# Patient Record
Sex: Male | Born: 1955 | Hispanic: No | Marital: Married
Health system: Midwestern US, Community
[De-identification: ages and names within clinical notes are randomized; demographics above are authoritative.]

## PROBLEM LIST (undated history)

## (undated) DIAGNOSIS — IMO0001 Reserved for inherently not codable concepts without codable children: Secondary | ICD-10-CM

## (undated) DIAGNOSIS — J3089 Other allergic rhinitis: Secondary | ICD-10-CM

---

## 2006-05-01 IMAGING — CT CT ABDOMEN W/O CM
1 of 2 series · 15 of 32 positions shown, 19 images · non-contrast
Comparison: none

CLINICAL DATA: Left lower quadrant abdominal pain. History of nephrolithiasis.

ABDOMEN CT WITHOUT CONTRAST - URINARY STONE PROTOCOL
TECHNIQUE: Multidetector CT imaging of the abdomen was performed following the
urinary stone protocol.  No oral or intravenous contrast was administered.
TECHNIQUE: Multidetector CT imaging of the pelvis was performed following the

[Series 2: renal stone · axial · 0.74mm/px · z∈[-494,-89]mm · 15 of 90 slices shown, 19 images]
[im 5/90  soft-tissue]
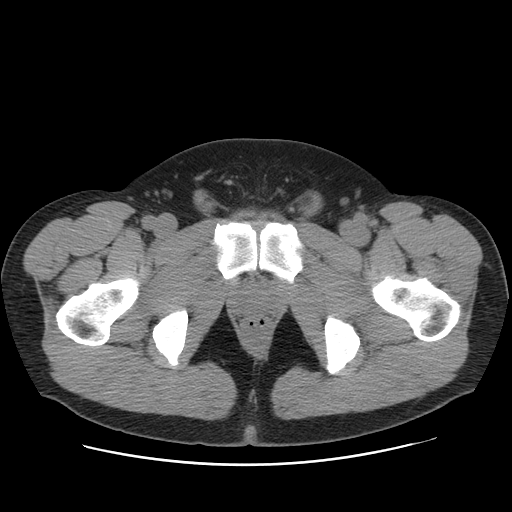
[im 5/90  bone]
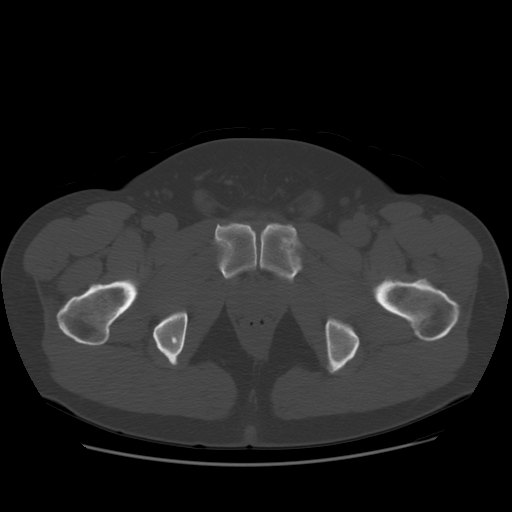
[im 13/90  soft-tissue]
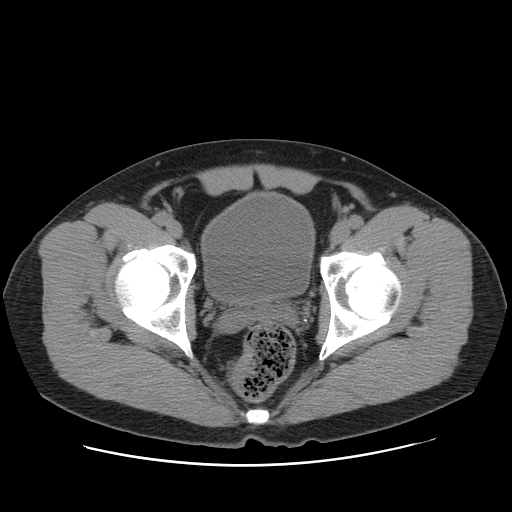
[im 21/90  soft-tissue]
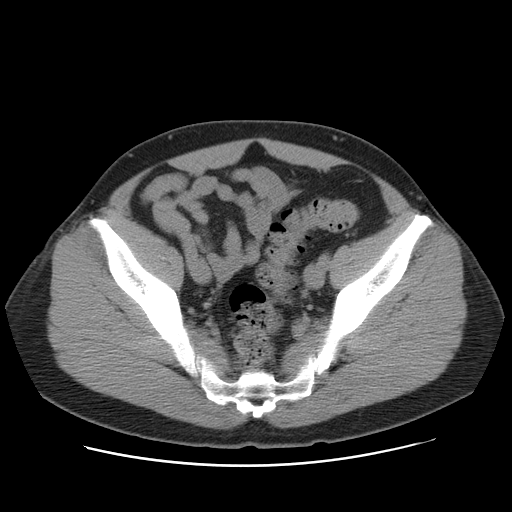
[im 25/90  soft-tissue]
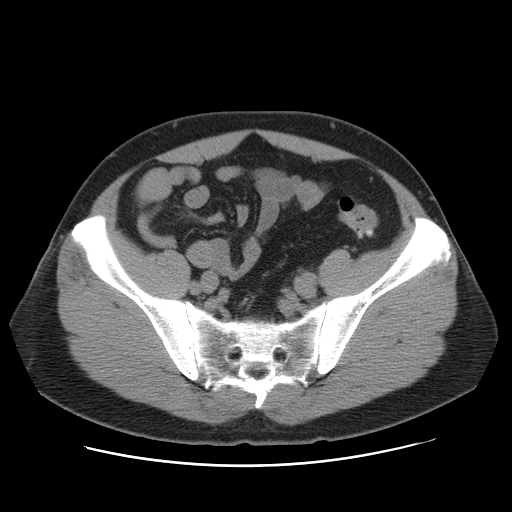
[im 33/90  soft-tissue]
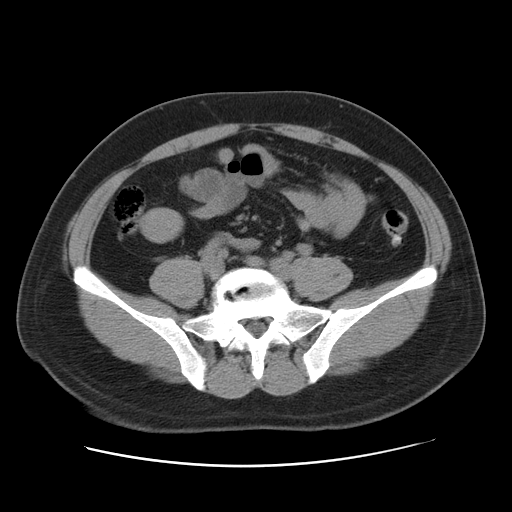
[im 37/90  soft-tissue]
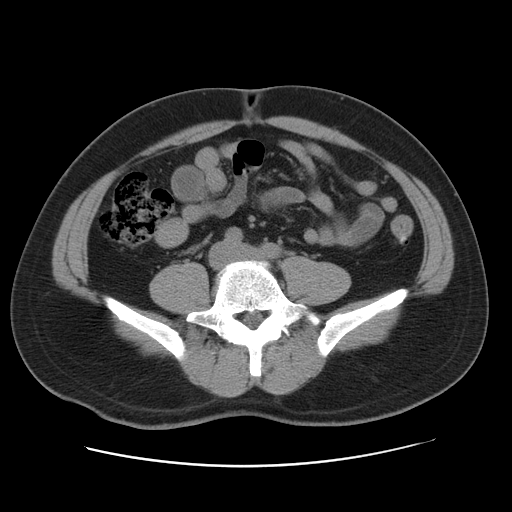
[im 45/90  soft-tissue]
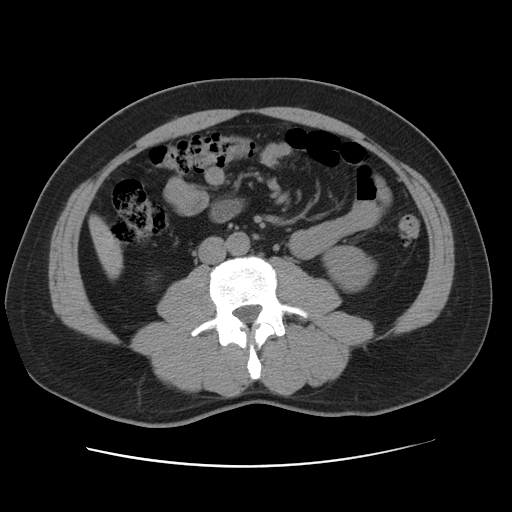
[im 53/90  soft-tissue]
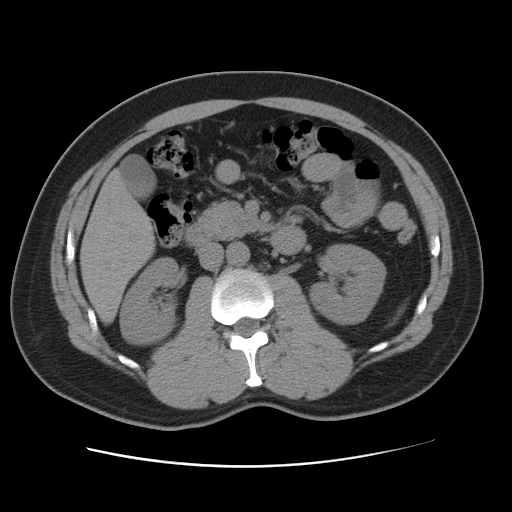
[im 57/90  soft-tissue]
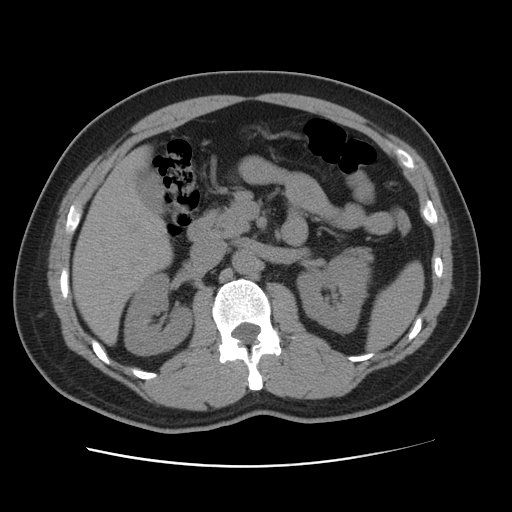
[im 57/90  bone]
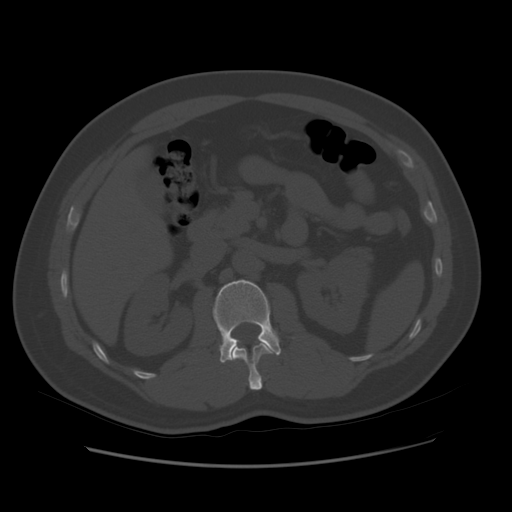
[im 65/90  soft-tissue]
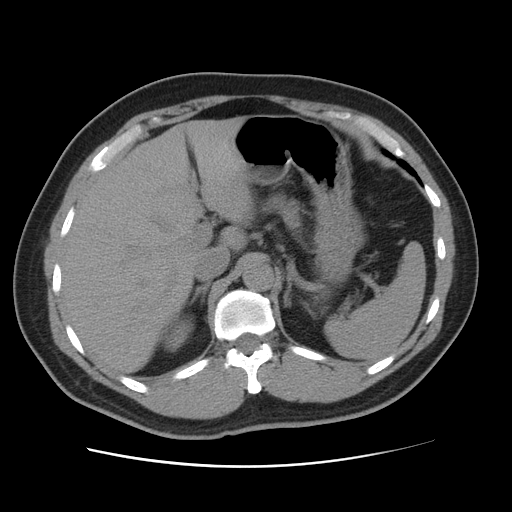
[im 69/90  soft-tissue]
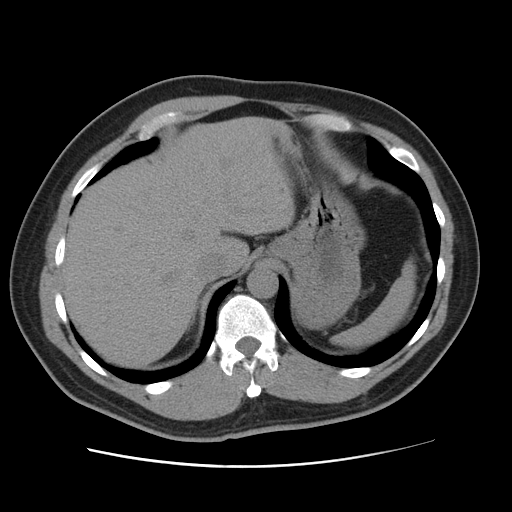
[im 73/90  lung]
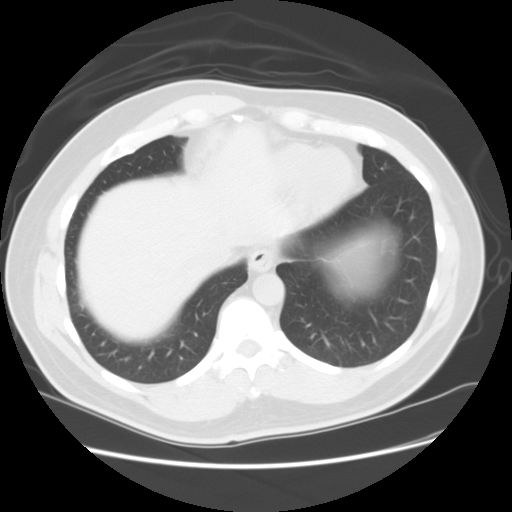
[im 77/90  soft-tissue]
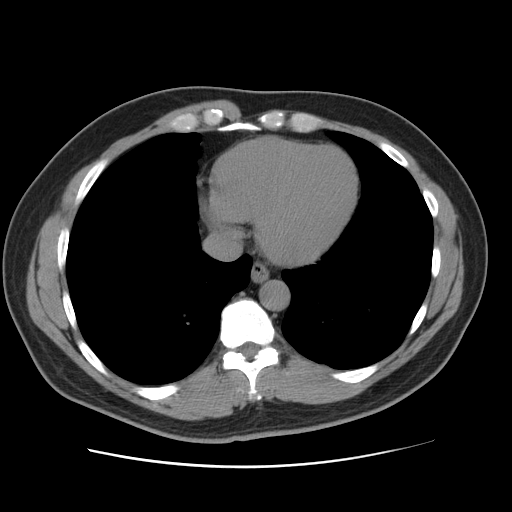
[im 77/90  lung]
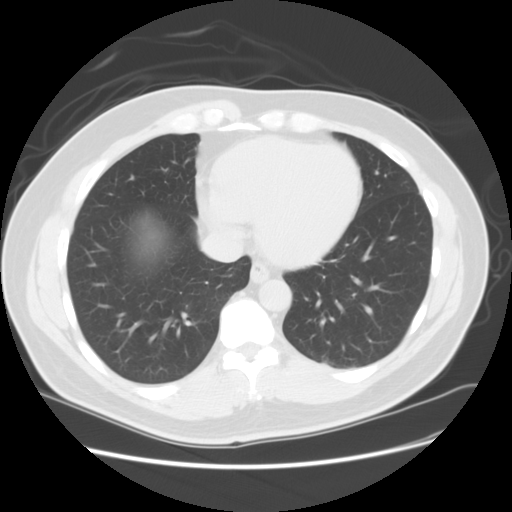
[im 81/90  lung]
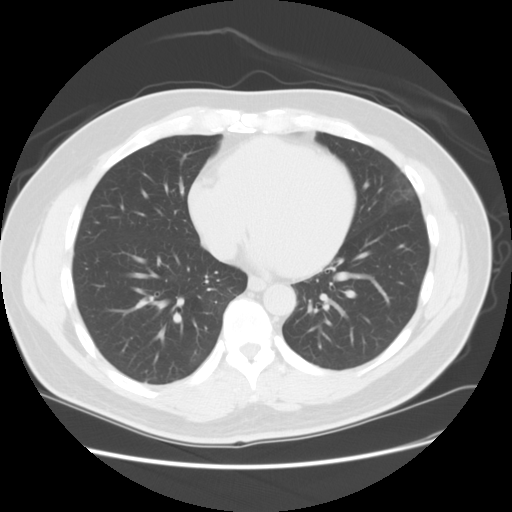
[im 85/90  soft-tissue]
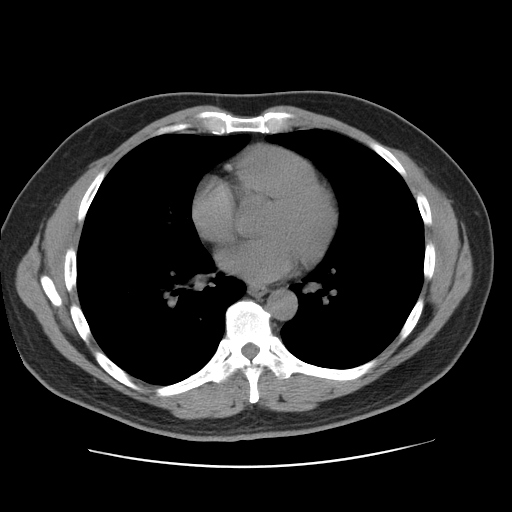
[im 85/90  lung]
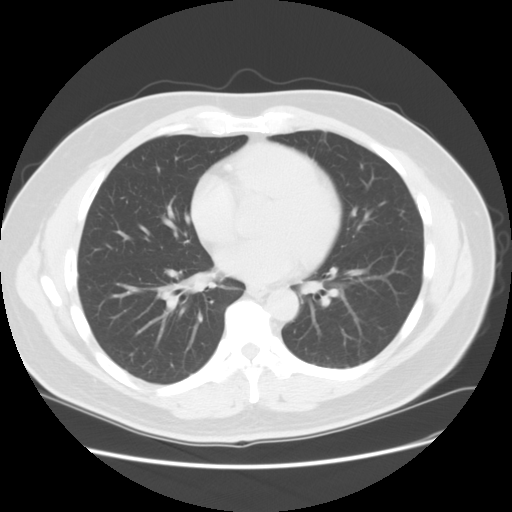

[15 of 32 positions shown; findings below may reference images not displayed]

FINDINGS: Clear lung bases. Normal appearing kidneys and ureters with no
calculi or hydronephrosis seen. Mild thoracolumbar rotary scoliosis and
degenerative changes. Multiple colon diverticula without evidence of
diverticulitis.

IMPRESSION

1. Colon diverticulosis without evidence of diverticulitis. 
2. No urinary tract calculi or hydronephrosis.

PELVIS CT WITHOUT CONTRAST - URINARY STONE PROTOCOL
FINDINGS: No bladder or ureteral calculi seen. Normal sized prostate gland.
Small bilateral pelvic phleboliths. Multiple sigmoid colon diverticula. No
evidence of diverticulitis.

IMPRESSION

1. Sigmoid diverticulosis without evidence of diverticulitis. 
2. No acute abnormality.

## 2010-05-26 NOTE — Telephone Encounter (Signed)
cvhn 05/23/10 3:54pm [id number]  06/05/1956 Dr Samuel Bouche, [phone] leave a  message on phone pt's wife would like a call back explaining why her husband has to have back to back visit in order to have a CPE she does not understand why he can not have an CPE on his initial visit. I tried by best to explain to pt???s wife the reason; felt it was best she speak with the nurse for further explanation.

## 2010-05-26 NOTE — Telephone Encounter (Signed)
Left message , Usually a cpe is not done on the first visit,however it is the Doctor's decision as to what he does.i

## 2010-06-10 NOTE — Progress Notes (Signed)
Andrew Fritz is a 54 y.o. male  Presenting for his annual checkup and health maintenance review  New patient to my practice, referred to me by his wife who works in [employer] at [employer] [place].   Prior medical care has been from Andrew Fritz in [place].   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        Cyst on right side.  Noticed 1 year ago.  Stable in size.  No pain.    Exercise: moderately active  Diet: generally follows a low fat low cholesterol diet  Health Maintenance   Topic Date Due   ??? INFLUENZA VACCINE  03/13/2010   ??? COLON CANCER SCRN COLONOSCOPY  05/13/2018     Health Maintenance reviewed  Last digital rectal exam:  04/2009    Vaccinations reviewed  Immunization History   Administered Date(s) Administered   ??? Influenza Vaccine Whole 12/11/2008   ??? TD Vaccine 04/12/2009       No current outpatient prescriptions on file.     Allergies: Review of patient's allergies indicates no known allergies.   History   Social History   ??? Marital Status: Married     Spouse Name: [name]     Number of Children: 2   ??? Years of Education: N/A   Occupational History   ??? Not on file.   Social History Main Topics   ??? Smoking status: Never Smoker    ??? Smokeless tobacco: Never Used   ??? Alcohol Use: 3.5 oz/week     7 drink(s) per week   ??? Drug Use: No   ??? Sexually Active: Yes   Other Topics Concern   ??? Not on file   Social History Narrative   ??? No narrative on file       Family History   Problem Relation Age of Onset   ??? Diabetes Mother    ??? Cancer Father      lymphoma   ??? Diabetes Sister    ??? Heart Disease Neg Hx    ??? Hypertension Neg Hx    ??? Stroke Neg Hx        Past Medical History   Diagnosis Date   ??? Submuscular lipoma of chest      right      Review of Systems - History obtained from the patient  General ROS: negative for - night sweats, weight gain or weight loss  Cardiovascular ROS: no chest pain, dyspnea on exertion, edema    Physical exam   Blood pressure 128/91, pulse 67, temperature 98.4 ??F (36.9 ??C), height 5' 9.5" (1.765 m), weight 180 lb (81.647 kg).  He appears well, alert and oriented x 3, pleasant and cooperative. Vitals as noted.   No rashes or significant lesions.  Neck supple and free of adenopathy, or masses.  No thyromegaly or carotid bruits. Cranial nerves normal. Lungs are clear to auscultation. Heart sounds are normal with no murmurs, clicks, gallops or rubs. Abdomen is soft, non- tender, with no masses or organomegaly. Extremities, peripheral pulses and reflexes are normal.  . Rectal: negative without mass, lesions or tenderness.    Skin is without rashes or suspicious lesions.    Assessment and Plan:  Well male - physical / health maintenance visit  He is healthy.   Watch BP, cholesterol, risk of DM (FH)  Orders Placed This Encounter   ??? INFLUENZA VIRUS VACCINE, SPLIT, IN INDIVIDS. >=3 YRS OF AGE, IM   ??? LIPID PANEL   ???  METABOLIC PANEL, COMPREHENSIVE   ??? PSA TOTAL (REFLEX TO FREE)       The patient is asked to make an attempt to improve diet and exercise patterns  Avoid tobacco products, excessive alcohol    Return for yearly wellness visits                  HPI      ROS    Physical Exam

## 2010-06-10 NOTE — Patient Instructions (Signed)
Well Visit???Men 50 to 65: After Your Visit  Your Care Instructions  Physical exams can help you stay healthy. Your doctor has checked your overall health and may have suggested ways to take good care of yourself. He or she also may have recommended tests. At home, you can help prevent illness with healthy eating, regular exercise, and other steps.  Follow-up care is a key part of your treatment and safety. Be sure to make and go to all appointments, and call your doctor if you are having problems. It???s also a good idea to know your test results and keep a list of the medicines you take.  How can you care for yourself at home?  ?? Reach and stay at a healthy weight. This will lower your risk for many problems, such as obesity, diabetes, heart disease, and high blood pressure.   ?? Get at least 30 minutes of exercise on most days of the week. Walking is a good choice. You also may want to do other activities, such as running, swimming, cycling, or playing tennis or team sports.   ?? Do not smoke. Smoking can make health problems worse. If you need help quitting, talk to your doctor about stop-smoking programs and medicines. These can increase your chances of quitting for good.   ?? Always wear sunscreen on exposed skin. Make sure the sunscreen blocks ultraviolet rays (both UVA and UVB) and has a sun protection factor (SPF) of at least 15. Use it every day, even when it is cloudy. Some doctors may recommend a higher SPF, such as 30.   ?? See a dentist one or two times a year for checkups and to have your teeth cleaned.   ?? Wear a seat belt in the car.   ?? Limit alcohol to 2 drinks a day. Too much alcohol can cause health problems.  Follow your doctor's advice about when to have certain tests. These tests can spot problems early.   ?? Cholesterol. Your doctor will tell you how often to have this done based on your overall health and other things that can increase your risk for heart disease. Usually, an adult who has coronary artery disease or diabetes should have cholesterol testing at least once a year. If you are being treated for high cholesterol, how often you have tests depends on your cholesterol level and the type of treatment.   ?? Blood pressure. Experts suggest that healthy adults with normal blood pressure (119/79 mm Hg or below) have their blood pressure checked at least every 1 to 2 years. This can be done during a routine doctor visit. If you have slightly higher or high blood pressure, or are at risk for heart disease, your doctor will suggest more frequent tests.   ?? Prostate exam. Talk to your doctor about whether and how often you should have a blood test (called a PSA test) for prostate cancer. Experts differ on how often men should have this test. They recommend that you discuss the benefits and risks of the test with your doctor.   ?? Diabetes. Ask your doctor whether you should have tests for diabetes.   ?? Vision. Some experts recommend that you have yearly exams for glaucoma and other age-related eye problems starting at age 50.   ?? Hearing. Tell your doctor if you notice any change in your hearing. You can have tests to find out how well you hear.   ?? Colon cancer. You should begin tests for colon cancer at age   50. You may have one of several tests. Your doctor will tell you how often to have tests based on your age and risk. Risks include whether you already had a precancerous polyp removed from your colon or whether your parent, brother, sister, or child has had colon cancer.    ?? Coronary artery disease. Every 5 years, you should have your risks for heart disease assessed. This test uses factors such as your age, blood pressure, cholesterol, and whether you smoke or have diabetes to show what your risk for a heart attack is over the next 10 years.   ?? Abdominal aortic aneurysm. Ask your doctor whether you should have a test to check for an aneurysm. You may need a test if you ever smoked or if your parent, brother, sister, or child has had an aneurysm.  When should you call for help?  Watch closely for changes in your health, and be sure to contact your doctor if you have any problems or symptoms that concern you.    Where can you learn more?   Go to http://www.healthwise.net/[facility]  Enter K916 in the search box to learn more about "Well Visit???Men 50 to 65: After Your Visit."   ?? 2006-2011 Healthwise, Incorporated. Care instructions adapted under license by [place] (which disclaims liability or warranty for this information). This care instruction is for use with your licensed healthcare professional. If you have questions about a medical condition or this instruction, always ask your healthcare professional. Healthwise, Incorporated disclaims any warranty or liability for your use of this information.  Content Version: 9.0.56994; Last Revised: March 05, 2008

## 2010-06-13 LAB — METABOLIC PANEL, COMPREHENSIVE
A-G Ratio: 1.7 (ref 1.1–2.5)
ALT (SGPT): 28 IU/L (ref 0–55)
AST (SGOT): 26 IU/L (ref 0–40)
Albumin: 4.5 g/dL (ref 3.5–5.5)
Alk. phosphatase: 53 IU/L (ref 25–150)
BUN/Creatinine ratio: 10 (ref 9–20)
BUN: 9 mg/dL (ref 6–24)
Bilirubin, total: 0.7 mg/dL (ref 0.0–1.2)
CO2: 25 mmol/L (ref 20–32)
Calcium: 9.5 mg/dL (ref 8.7–10.2)
Chloride: 99 mmol/L (ref 97–108)
Creatinine: 0.9 mg/dL (ref 0.76–1.27)
GFR est AA: 113 mL/min/{1.73_m2} (ref >59)
GFR est non-AA: 98 mL/min/{1.73_m2} (ref >59)
GLOBULIN, TOTAL: 2.6 g/dL (ref 1.5–4.5)
Glucose: 98 mg/dL (ref 65–99)
Potassium: 4.4 mmol/L (ref 3.5–5.2)
Protein, total: 7.1 g/dL (ref 6.0–8.5)
Sodium: 138 mmol/L (ref 135–145)

## 2010-06-13 LAB — LIPID PANEL
Cholesterol, total: 204 mg/dL — ABNORMAL HIGH (ref 100–199)
HDL Cholesterol: 65 mg/dL (ref >39)
LDL, calculated: 131 mg/dL — ABNORMAL HIGH (ref 0–99)
Triglyceride: 38 mg/dL (ref 0–149)
VLDL, calculated: 8 mg/dL (ref 5–40)

## 2010-06-13 LAB — PSA TOTAL (REFLEX TO FREE): Prostate Specific Ag: 3.1 ng/mL (ref 0.0–4.0)

## 2010-06-16 NOTE — Progress Notes (Addendum)
Quick Note:    Results reviewed. Message sent to patient via MyChart. Please refer to encounter for details.     ______

## 2010-10-06 MED ORDER — LORATADINE 10 MG TAB
10 mg | ORAL_TABLET | Freq: Every day | ORAL | Status: DC
Start: 2010-10-06 — End: 2011-08-04

## 2010-10-06 MED ORDER — IBUPROFEN 600 MG TAB
600 mg | ORAL_TABLET | Freq: Four times a day (QID) | ORAL | Status: DC | PRN
Start: 2010-10-06 — End: 2011-08-04

## 2010-10-06 MED ORDER — DEXTROMETHORPHAN POLY COMPLEX SR 30 MG/5 ML ORAL 12 HR SUSP
30 mg/5 mL | Freq: Two times a day (BID) | ORAL | Status: DC
Start: 2010-10-06 — End: 2011-04-29

## 2010-10-06 MED ORDER — GUAIFENESIN SR 600 MG TAB
600 mg | ORAL_TABLET | Freq: Two times a day (BID) | ORAL | Status: DC
Start: 2010-10-06 — End: 2011-08-04

## 2010-10-06 NOTE — Telephone Encounter (Signed)
Prescription sent to pharmacy, per request

## 2010-10-06 NOTE — Telephone Encounter (Signed)
Patient has allergies and would like for you to call in the follow medication for him so that his ins will cover it.Clartin Advil,Delsym, Mucinex  [facility] at [facility] in [place]

## 2011-01-02 NOTE — Telephone Encounter (Signed)
Left message for pt to call re: Podiatrist

## 2011-01-02 NOTE — Telephone Encounter (Signed)
Message copied by Hazeline Junker on Fri Jan 02, 2011  9:46 AM  ------       Message from: Festus Aloe       Created: Fri Jan 02, 2011  9:19 AM       Regarding: Dr.Port/Telephone         Pt would like a referral to a Podiatrist. Best contact number is [phone].

## 2011-01-02 NOTE — Telephone Encounter (Signed)
Appointment with Dr Steward Ros on 01/06/2011 at 10 am,pt aware

## 2011-01-05 NOTE — Telephone Encounter (Signed)
Pt is requesting a return call today from the nurse, with confirmation concerning his appt with the Podiatrist. Pt can be reached on [phone].

## 2011-04-29 MED ORDER — DEXTROMETHORPHAN POLY COMPLEX SR 30 MG/5 ML ORAL 12 HR SUSP
30 mg/5 mL | Freq: Two times a day (BID) | ORAL | Status: AC
Start: 2011-04-29 — End: 2011-05-09

## 2011-08-04 NOTE — Progress Notes (Signed)
Reviewed record in preparation for visit and have obtained necessary documentation.

## 2011-08-04 NOTE — Progress Notes (Signed)
Andrew Fritz is a 56 y.o. male  Presenting for his annual checkup and health maintenance review    Reports he is snoring more - may be stopping breathing.  Feels rested in AM.  No hx of sleep study and would like one.      He is losing weight w diet, exercising. Denies nasal congestion.       Exercise: very active  Diet: generally follows a low fat low cholesterol diet  No health maintenance topics applied.  Health Maintenance reviewed  Last digital rectal exam:  n/a  Lab Results   Component Value Date/Time    Prostate Specific Ag 3.1 06/12/2010  8:20 AM       Vaccinations reviewed  Immunization History   Administered Date(s) Administered   ??? Influenza Vaccine Split 06/10/2010   ??? Influenza Vaccine Whole 12/11/2008   ??? TD Vaccine 04/12/2009       Past Medical History   Diagnosis Date   ??? Submuscular lipoma of chest      right lateral chest     Review of patient's allergies indicates no known allergies.   Current Outpatient Prescriptions   Medication Sig   ??? MULTIVITAMIN (MULTIPLE VITAMIN PO) Take  by mouth.     ??? DOCOSAHEXANOIC ACID/EPA (FISH OIL PO) Take  by mouth.     ??? OTHER       SOCIAL HX:  reports that he has never smoked. He has never used smokeless tobacco. He reports that he drinks about 3.5 ounces of alcohol per week. He reports that he does not use illicit drugs.    FAMILY HX: family history includes Cancer in his father and Diabetes in his mother and sister.  There is no history of Heart Disease, and Hypertension, and Stroke, .    Review of Systems - History obtained from the patient  General ROS: negative for - night sweats, weight gain or weight loss  Cardiovascular ROS: no chest pain, dyspnea on exertion, edema    Physical exam  Blood pressure 134/90, pulse 59, temperature 98.5 ??F (36.9 ??C), temperature source Oral, height 5' 9.5" (1.765 m), weight 171 lb 12.8 oz (77.928 kg), SpO2 99.00%.  Wt Readings from Last 3 Encounters:   08/04/11 171 lb 12.8 oz (77.928 kg)   06/10/10 180 lb (81.647 kg)     He  appears well, alert and oriented x 3, pleasant and cooperative. Vitals as noted.   No rashes or significant lesions.  Neck supple and free of adenopathy, or masses.  No thyromegaly or carotid bruits. Cranial nerves normal. Lungs are clear to auscultation. Heart sounds are normal with no murmurs, clicks, gallops or rubs. Abdomen is soft, non- tender, with no masses or organomegaly. Extremities, peripheral pulses and reflexes are normal.   Skin is without rashes or suspicious lesions.  Rectal - 1+ diffuse prostate enlargement    Assessment and Plan:    Well male - physical / health maintenance visit  Snoring - he requests sleep study  Orders Placed This Encounter   ??? METABOLIC PANEL, COMPREHENSIVE   ??? LIPID PANEL   ??? PSA TOTAL (REFLEX TO FREE)   ??? MULTIVITAMIN (MULTIPLE VITAMIN PO)   ??? DOCOSAHEXANOIC ACID/EPA (FISH OIL PO)   ??? OTHER     The patient is asked to make an attempt to improve diet and exercise patterns  Avoid tobacco products, excessive alcohol    Return for yearly wellness visits

## 2011-08-04 NOTE — Patient Instructions (Signed)
Well Visit???Men 50 to 65: After Your Visit  Your Care Instructions  Physical exams can help you stay healthy. Your doctor has checked your overall health and may have suggested ways to take good care of yourself. He or she also may have recommended tests. At home, you can help prevent illness with healthy eating, regular exercise, and other steps.  Follow-up care is a key part of your treatment and safety. Be sure to make and go to all appointments, and call your doctor if you are having problems. It???s also a good idea to know your test results and keep a list of the medicines you take.  How can you care for yourself at home?  ?? Reach and stay at a healthy weight. This will lower your risk for many problems, such as obesity, diabetes, heart disease, and high blood pressure.   ?? Get at least 30 minutes of exercise on most days of the week. Walking is a good choice. You also may want to do other activities, such as running, swimming, cycling, or playing tennis or team sports.   ?? Do not smoke. Smoking can make health problems worse. If you need help quitting, talk to your doctor about stop-smoking programs and medicines. These can increase your chances of quitting for good.   ?? Always wear sunscreen on exposed skin. Make sure the sunscreen blocks ultraviolet rays (both UVA and UVB) and has a sun protection factor (SPF) of at least 15. Use it every day, even when it is cloudy. Some doctors may recommend a higher SPF, such as 30.   ?? See a dentist one or two times a year for checkups and to have your teeth cleaned.   ?? Wear a seat belt in the car.   ?? Limit alcohol to 2 drinks a day. Too much alcohol can cause health problems.   Follow your doctor's advice about when to have certain tests. These tests can spot problems early.  ?? Cholesterol. Your doctor will tell you how often to have this done based on your overall health and other things that can increase your risk for heart disease. Usually, an adult who has  coronary artery disease or diabetes should have cholesterol testing at least once a year. If you are being treated for high cholesterol, how often you have tests depends on your cholesterol level and the type of treatment.   ?? Blood pressure. Experts suggest that healthy adults with normal blood pressure (119/79 mm Hg or below) have their blood pressure checked at least every 1 to 2 years. This can be done during a routine doctor visit. If you have slightly higher or high blood pressure, or are at risk for heart disease, your doctor will suggest more frequent tests.   ?? Prostate exam. Talk to your doctor about whether and how often you should have a blood test (called a PSA test) for prostate cancer. Experts differ on how often men should have this test. They recommend that you discuss the benefits and risks of the test with your doctor.   ?? Diabetes. Ask your doctor whether you should have tests for diabetes.   ?? Vision. Some experts recommend that you have yearly exams for glaucoma and other age-related eye problems starting at age 50.   ?? Hearing. Tell your doctor if you notice any change in your hearing. You can have tests to find out how well you hear.   ?? Colon cancer. You should begin tests for colon cancer at   age 50. You may have one of several tests. Your doctor will tell you how often to have tests based on your age and risk. Risks include whether you already had a precancerous polyp removed from your colon or whether your parent, brother, sister, or child has had colon cancer.   ?? Coronary artery disease. Every 5 years, you should have your risks for heart disease assessed. This test uses factors such as your age, blood pressure, cholesterol, and whether you smoke or have diabetes to show what your risk for a heart attack is over the next 10 years.   ?? Abdominal aortic aneurysm. Ask your doctor whether you should have a test to check for an aneurysm. You may need a test if you ever smoked or if your  parent, brother, sister, or child has had an aneurysm.   When should you call for help?  Watch closely for changes in your health, and be sure to contact your doctor if you have any problems or symptoms that concern you.    Where can you learn more?    Go to http://www.healthwise.net/[facility]   Enter K916 in the search box to learn more about "Well Visit???Men 50 to 65: After Your Visit."    ?? 2006-2012 Healthwise, Incorporated. Care instructions adapted under license by [employer] [place] (which disclaims liability or warranty for this information). This care instruction is for use with your licensed healthcare professional. If you have questions about a medical condition or this instruction, always ask your healthcare professional. Healthwise, Incorporated disclaims any warranty or liability for your use of this information.  Content Version: 9.5.76532; Last Revised: February 05, 2010

## 2011-08-05 LAB — LIPID PANEL
Cholesterol, total: 211 mg/dL — ABNORMAL HIGH (ref 100–199)
HDL Cholesterol: 78 mg/dL (ref >39)
LDL, calculated: 124 mg/dL — ABNORMAL HIGH (ref 0–99)
Triglyceride: 46 mg/dL (ref 0–149)
VLDL, calculated: 9 mg/dL (ref 5–40)

## 2011-08-05 LAB — METABOLIC PANEL, COMPREHENSIVE
A-G Ratio: 1.6 (ref 1.1–2.5)
ALT (SGPT): 27 IU/L (ref 0–44)
AST (SGOT): 23 IU/L (ref 0–40)
Albumin: 4.2 g/dL (ref 3.5–5.5)
Alk. phosphatase: 53 IU/L (ref 25–150)
BUN/Creatinine ratio: 16 (ref 9–20)
BUN: 13 mg/dL (ref 6–24)
Bilirubin, total: 0.8 mg/dL (ref 0.0–1.2)
CO2: 24 mmol/L (ref 20–32)
Calcium: 9.1 mg/dL (ref 8.7–10.2)
Chloride: 102 mmol/L (ref 97–108)
Creatinine: 0.82 mg/dL (ref 0.76–1.27)
GFR est non-AA: 101 mL/min/{1.73_m2} (ref >59)
GLOBULIN, TOTAL: 2.6 g/dL (ref 1.5–4.5)
Glucose: 106 mg/dL — ABNORMAL HIGH (ref 65–99)
Potassium: 4.6 mmol/L (ref 3.5–5.2)
Protein, total: 6.8 g/dL (ref 6.0–8.5)
Sodium: 141 mmol/L (ref 134–144)
eGFR If African American: 117 mL/min/{1.73_m2} (ref >59)

## 2011-08-05 LAB — PSA TOTAL (REFLEX TO FREE): Prostate Specific Ag: 3.5 ng/mL (ref 0.0–4.0)

## 2011-08-05 NOTE — Telephone Encounter (Signed)
I just received his labs and sent him a MyChart msg

## 2011-08-05 NOTE — Telephone Encounter (Signed)
Please call pt re: lab results. [phone] Thanks

## 2011-08-07 NOTE — Telephone Encounter (Signed)
Message copied by Joycelyn Schmid on Fri Aug 07, 2011  8:29 AM  ------       Message from: Gloriajean Dell       Created: Thu Aug 06, 2011  4:39 PM       Regarding: Dr. Orie Fisherman         Please call the patient at [phone] with his CPE results.

## 2011-08-10 NOTE — Telephone Encounter (Signed)
Advised pt of results and to check MyChart repeat labs in 6 months,

## 2011-11-09 NOTE — Telephone Encounter (Signed)
Pt would like for generic for clariton to [facility] [phone]

## 2011-11-09 NOTE — Telephone Encounter (Signed)
[facility] and pt # [phone]

## 2011-11-09 NOTE — Telephone Encounter (Signed)
Called left detailed msg. This is otc he can get it from the pharmacy.

## 2011-11-10 MED ORDER — LORATADINE 10 MG TAB
10 mg | ORAL_TABLET | Freq: Every day | ORAL | Status: DC
Start: 2011-11-10 — End: 2012-02-08

## 2012-02-08 LAB — AMB POC HEMOGLOBIN A1C: Hemoglobin A1c (POC): 5.7 %

## 2012-02-08 LAB — AMB POC GLUCOSE BLOOD, BY GLUCOSE MONITORING DEVICE: Glucose POC: 104 mg/dL

## 2012-02-08 MED ORDER — SCOPOLAMINE (1.3-1.5) MG 72 HR TRANSDERM PATCH
1 mg over 3 days | MEDICATED_PATCH | TRANSDERMAL | Status: DC
Start: 2012-02-08 — End: 2012-08-09

## 2012-02-08 NOTE — Progress Notes (Signed)
HISTORY OF PRESENT ILLNESS    Chief Complaint   Patient presents with   ??? Rash     waist- it is gone now but some scarring/ mother passed recently   ??? Request For New Medication     for nausea- going deep [place] fishing       Presents for follow-up    Will be traveling/fishing, requests patch.  Hx of motion sickness       His mother passed away 02-13-2012 after long post-stroke course since 2011.      Rash on both inguinal regions for 1 month.  Using protopic (tacrolimus) 0.1% and has is resolved, but some mild scarring.   He went to the [place] prior to onset    Impaired fasting glucose / Pre-diabetes follow-up  Lab Results   Component Value Date/Time    Glucose 106 08/04/2011 12:00 AM   Last hemoglobin a1c   No results found for this basename: HBA1C,  HGBE8,  HA1CLT   Last Point of Care HGB A1C  Hemoglobin A1c (POC)   Date Value Range Status   02/08/2012 5.7   Final    Diabetic diet compliance: compliant most of the time. Patient does not perform home glucose monitoring.        Review of Systems   All other systems reviewed and are negative, except as noted in HPI    Past Medical and Surgical History   has a past medical history of Submuscular lipoma of chest; Snoring (08/04/2011); and Sebaceous cyst (08/04/2011).   has past surgical history that includes colonoscopy (2009).     reports that he has never smoked. He has never used smokeless tobacco. He reports that he drinks about 3.5 ounces of alcohol per week. He reports that he does not use illicit drugs.    Physical Exam   Nursing note and vitals reviewed.  Blood pressure 133/90, pulse 67, temperature 98.2 ??F (36.8 ??C), temperature source Oral, resp. rate 12, height 5' 9.5" (1.765 m), weight 176 lb (79.833 kg), SpO2 98.00%.  Constitutional: oriented to person, place, and time. No distress.    Eyes: Conjunctivae are normal.   Cardiovascular: Normal rate.  regular rhythm, no murmurs or gallops  Pulmonary/Chest: Effort normal.   CTAB  Musculoskeletal:  no edema.    Neurological: Alert and oriented to person, place, and time.   Skin: scarring rash inguinal regions bil  Psychiatric: Normal mood and affect. Behavior is normal.     ASSESSMENT and PLAN  Andrew Fritz was seen today for rash and request for new medication.    Diagnoses and associated orders for this visit:    Rash and nonspecific skin eruption - resolved probably contact dermatitis    Ifg (impaired fasting glucose)- The patient is asked to make an attempt to improve diet and exercise patterns to aid in medical management of this problem. Return to clinic in 6 months to repeat your blood work.     - AMB POC GLUCOSE BLOOD, BY GLUCOSE MONITORING DEVICE  - AMB POC HEMOGLOBIN A1C    Motion sickness  - scopolamine (TRANSDERM-SCOP) 1.5 mg; 1 Patch by TransDERmal route every seventy-two (72) hours.          lab results and schedule of future lab studies reviewed with patient  reviewed medications and side effects in detail    Return to clinic for further evaluation if new symptoms develop    Follow-up Disposition: Not on File    Current Outpatient Prescriptions   Medication  Sig   ??? MULTIVITAMIN (MULTIPLE VITAMIN PO) Take  by mouth.     ??? DOCOSAHEXANOIC ACID/EPA (FISH OIL PO) Take  by mouth.     ??? OTHER

## 2012-08-09 NOTE — Progress Notes (Signed)
146/97 right, 136/105 left- both by machine

## 2012-08-09 NOTE — Patient Instructions (Signed)
DASH Diet: After Your Visit  Your Care Instructions  The DASH diet is an eating plan that can help lower your blood pressure. DASH stands for Dietary Approaches to Stop Hypertension. Hypertension is high blood pressure.  The DASH diet focuses on eating foods that are high in calcium, potassium, and magnesium. These nutrients can lower blood pressure. The foods that are highest in these nutrients are fruits, vegetables, low-fat dairy products, nuts, seeds, and legumes. But taking calcium, potassium, and magnesium supplements instead of eating foods that are high in those nutrients does not have the same effect. The DASH diet also includes whole grains, fish, and poultry.  The DASH diet is one of several lifestyle changes your doctor may recommend to lower your high blood pressure. Your doctor may also want you to decrease the amount of sodium in your diet. Lowering sodium while following the DASH diet can lower blood pressure even further than just the DASH diet alone.  Follow-up care is a key part of your treatment and safety. Be sure to make and go to all appointments, and call your doctor if you are having problems. It's also a good idea to know your test results and keep a list of the medicines you take.  How can you care for yourself at home?  Following the DASH diet  ?? Eat 4 to 5 servings of fruit each day. A serving is 1 medium-sized piece of fruit, ?? cup chopped or canned fruit, 1/4 cup dried fruit, or 4 ounces (?? cup) of fruit juice. Choose fruit more often than fruit juice.  ?? Eat 4 to 5 servings of vegetables each day. A serving is 1 cup of lettuce or raw leafy vegetables, ?? cup of chopped or cooked vegetables, or 4 ounces (?? cup) of vegetable juice. Choose vegetables more often than vegetable juice.  ?? Get 2 to 3 servings of low-fat and fat-free dairy each day. A serving is 8 ounces of milk, 1 cup of yogurt, or 1 ?? ounces of cheese.  ?? Eat 6 to 8 servings of grains each day. A serving is 1 slice of  bread, 1 ounce of dry cereal, or ?? cup of cooked rice, pasta, or cooked cereal. Try to choose whole-grain products as much as possible.  ?? Limit lean meat, poultry, and fish to 2 servings each day. A serving is 3 ounces, about the size of a deck of cards.  ?? Eat 4 to 5 servings of nuts, seeds, and legumes (cooked dried beans, lentils, and split peas) each week. A serving is 1/3 cup of nuts, 2 tablespoons of seeds, or ?? cup cooked dried beans or peas.  ?? Limit fats and oils to 2 to 3 servings each day. A serving is 1 teaspoon of vegetable oil or 2 tablespoons of salad dressing.  ?? Limit sweets and added sugars to 5 servings or less a week. A serving is 1 tablespoon jelly or jam, ?? cup sorbet, or 1 cup of lemonade.  ?? Eat less than 2,300 milligrams (mg) of sodium a day. If you have high blood pressure, diabetes, or chronic kidney disease, if you are African-American, or if you are older than age 50, try to limit the amount of sodium you eat to less than 1,500 mg a day.  Tips for success  ?? Start small. Do not try to make dramatic changes to your diet all at once. You might feel that you are missing out on your favorite foods and then be more   likely to not follow the plan. Make small changes, and stick with them. Once those changes become habit, add a few more changes.  ?? Try some of the following:  ?? Make it a goal to eat a fruit or vegetable at every meal and at snacks. This will make it easy to get the recommended amount of fruits and vegetables each day.  ?? Try yogurt topped with fruit and nuts for a snack or healthy dessert.  ?? Add lettuce, tomato, cucumber, and onion to sandwiches.  ?? Combine a ready-made pizza crust with low-fat mozzarella cheese and lots of vegetable toppings. Try using tomatoes, squash, spinach, broccoli, carrots, cauliflower, and onions.  ?? Have a variety of cut-up vegetables with a low-fat dip as an appetizer instead of chips and dip.  ?? Sprinkle sunflower seeds or chopped almonds over  salads. Or try adding chopped walnuts or almonds to cooked vegetables.  ?? Try some vegetarian meals using beans and peas. Add garbanzo or kidney beans to salads. Make burritos and tacos with mashed pinto beans or black beans.   Where can you learn more?   Go to http://www.healthwise.net/[facility]  Enter H967 in the search box to learn more about "DASH Diet: After Your Visit."   ?? 2006-2013 [employer]. Care instructions adapted under license by [place] (which disclaims liability or warranty for this information). This care instruction is for use with your licensed healthcare professional. If you have questions about a medical condition or this instruction, always ask your healthcare professional. Healthwise, Incorporated disclaims any warranty or liability for your use of this information.  Content Version: 9.9.209917; Last Revised: Nov 18, 2011

## 2012-08-09 NOTE — Progress Notes (Signed)
Andrew Fritz is a 57 y.o. male  Presenting for his annual checkup and health maintenance review    Exercise: moderately active  Diet: generally follows a low fat low cholesterol diet, nonsmoker  Health Maintenance   Topic   ??? Influenza Vaccine    ??? Colon Cancer Scrn Colonoscopy      Health Maintenance reviewed  Last digital rectal exam:  2013   Lab Results   Component Value Date/Time    Prostate Specific Ag 3.5 08/04/2011 12:00 AM    Prostate Specific Ag 3.1 06/12/2010  8:20 AM       Vaccinations reviewed  Immunization History   Administered Date(s) Administered   ??? Influenza Vaccine Split 06/10/2010   ??? Influenza Vaccine Whole 12/11/2008   ??? TD Vaccine 04/12/2009       Past Medical History   Diagnosis Date   ??? 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 Snoring 08/04/2011   ??? Sebaceous cyst 08/04/2011   ??? 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    Review of patient's allergies indicates no known allergies.   Current Outpatient Prescriptions   Medication Sig   ??? MULTIVITAMIN (MULTIPLE VITAMIN PO) Take  by mouth.     ??? DOCOSAHEXANOIC ACID/EPA (FISH OIL PO) Take  by mouth.     ??? OTHER       No current facility-administered medications for this visit.     SOCIAL HX:  reports that he has never smoked. He has never used smokeless tobacco. He reports that he drinks about 3.5 ounces of alcohol per week. He reports that he does not use illicit drugs.    FAMILY HX: family history includes Cancer in his father; Diabetes in his mother and sister; and Stroke in his mother.  There is no history of Heart Disease and Hypertension.    Review of Systems - History obtained from the patient  General ROS: negative for - night sweats, weight gain or weight loss  Cardiovascular ROS: no chest pain, dyspnea on exertion, edema    Physical exam  Blood pressure 140/94, pulse 80, temperature 97.8 ??F (36.6 ??C), temperature source Oral, resp. rate 12,  height 5' 9.5" (1.765 m), weight 177 lb 6.4 oz (80.468 kg), SpO2 99.00%.  Wt Readings from Last 3 Encounters:   08/09/12 177 lb 6.4 oz (80.468 kg)   02/08/12 176 lb (79.833 kg)   08/04/11 171 lb 12.8 oz (77.928 kg)     He appears well, alert and oriented x 3, pleasant and cooperative. Vitals as noted.   No rashes or significant lesions.  Neck supple and free of adenopathy, or masses.  No thyromegaly or carotid bruits. Cranial nerves normal. Lungs are clear to auscultation. Heart sounds are normal with no murmurs, clicks, gallops or rubs. Abdomen is soft, non- tender, with no masses or organomegaly. Extremities, peripheral pulses and reflexes are normal.  . RECTAL/PROSTATE EXAM: smooth and symmetric without nodules or tenderness, enlarged 1+.   Skin is without rashes or suspicious lesions.      Assessment and Plan:    Well male - physical / health maintenance visit  BP elevated- DASH DIET  Prostate mildly enlarged, but stable, asymptomatic  Watch carbs due to IFG hx  Orders Placed This Encounter   ??? LIPID PANEL   ??? METABOLIC PANEL, COMPREHENSIVE   ??? PROSTATE SPECIFIC AG  The patient is asked to make an attempt to improve diet and exercise patterns  Avoid tobacco products, excessive alcohol    Return for yearly wellness visits

## 2012-08-10 LAB — METABOLIC PANEL, COMPREHENSIVE
A-G Ratio: 1.7 (ref 1.1–2.5)
ALT (SGPT): 28 IU/L (ref 0–44)
AST (SGOT): 29 IU/L (ref 0–40)
Albumin: 4.4 g/dL (ref 3.5–5.5)
Alk. phosphatase: 64 IU/L (ref 39–117)
BUN/Creatinine ratio: 12 (ref 9–20)
BUN: 11 mg/dL (ref 6–24)
Bilirubin, total: 0.9 mg/dL (ref 0.0–1.2)
CO2: 27 mmol/L (ref 19–28)
Calcium: 9.3 mg/dL (ref 8.7–10.2)
Chloride: 99 mmol/L (ref 97–108)
Creatinine: 0.93 mg/dL (ref 0.76–1.27)
GFR est AA: 107 mL/min/{1.73_m2} (ref >59)
GFR est non-AA: 93 mL/min/{1.73_m2} (ref >59)
GLOBULIN, TOTAL: 2.6 g/dL (ref 1.5–4.5)
Glucose: 106 mg/dL — ABNORMAL HIGH (ref 65–99)
Potassium: 4.2 mmol/L (ref 3.5–5.2)
Protein, total: 7 g/dL (ref 6.0–8.5)
Sodium: 138 mmol/L (ref 134–144)

## 2012-08-10 LAB — LIPID PANEL
Cholesterol, total: 200 mg/dL — ABNORMAL HIGH (ref 100–199)
HDL Cholesterol: 70 mg/dL (ref >39)
LDL, calculated: 118 mg/dL — ABNORMAL HIGH (ref 0–99)
Triglyceride: 62 mg/dL (ref 0–149)
VLDL, calculated: 12 mg/dL (ref 5–40)

## 2012-08-10 LAB — PSA, DIAGNOSTIC (PROSTATE SPECIFIC AG): Prostate Specific Ag: 4.5 ng/mL — ABNORMAL HIGH (ref 0.0–4.0)

## 2012-08-12 NOTE — Telephone Encounter (Signed)
Patient would like a call back regarding his lab results no [phone]

## 2012-08-16 NOTE — Telephone Encounter (Signed)
We communicated via Mychart now

## 2012-08-16 NOTE — Telephone Encounter (Signed)
Patient called to get return the doctors call but the patient does not have access to mychart. The contact number is [phone].

## 2012-08-16 NOTE — Telephone Encounter (Signed)
Dr Samuel Bouche addressed labs .

## 2012-08-16 NOTE — Telephone Encounter (Signed)
Patient would like for Dr Samuel Bouche to call him today with his lab results,.

## 2012-08-16 NOTE — Telephone Encounter (Signed)
He sent me a Radio broadcast assistant

## 2012-08-16 NOTE — Telephone Encounter (Signed)
I called and left msg.   Asked him to Mychart me tonight since I am leaving.  I sent him a Radio broadcast assistant re results.   I told him on phone msg I would call him tomorrow if he does not mychart me

## 2012-11-07 MED ORDER — LORATADINE 10 MG TAB
10 mg | ORAL_TABLET | Freq: Every day | ORAL | Status: DC
Start: 2012-11-07 — End: 2013-01-31

## 2013-01-31 NOTE — Telephone Encounter (Signed)
Lab order faxed as requested.

## 2013-01-31 NOTE — Progress Notes (Signed)
HISTORY OF PRESENT ILLNESS    Chief Complaint   Patient presents with   ??? Follow-up   ??? Elevated PSA       Presents for follow-up    Knee pain  he presents with left knee pain for 3 weeks.  Onset was after he Biked, ran. It is anterior, mild Symptoms have been intermittent and paroxysmal since that time. Prior history of related problems: no prior problems with this area in the past.  Took OTC medication w/o relief.      Hypertension  Hypertension ROS: no TIA's, no chest pain on exertion, no dyspnea on exertion, no swelling of ankles     reports that he has never smoked. He has never used smokeless tobacco.    reports that he drinks about 3.5 ounces of alcohol per week.   BP Readings from Last 2 Encounters:   01/31/13 151/96   08/09/12 140/94     PSA elevation.  1+ enlargement w no nodules prostate exam 08/09/12.   Lab Results   Component Value Date/Time    Prostate Specific Ag 4.5 08/09/2012 10:04 AM    Prostate Specific Ag 3.5 08/04/2011 12:00 AM    Prostate Specific Ag 3.1 06/12/2010  8:20 AM           Review of Systems   All other systems reviewed and are negative, except as noted in HPI    Past Medical and Surgical History   has a past medical history of Submuscular lipoma of chest; Snoring (08/04/2011); Sebaceous cyst (08/04/2011); Prostate enlargement (07/2011); and HTN (hypertension).   has past surgical history that includes colonoscopy (2009).     reports that he has never smoked. He has never used smokeless tobacco. He reports that he drinks about 3.5 ounces of alcohol per week. He reports that he does not use illicit drugs.    Physical Exam   Nursing note and vitals reviewed.  Blood pressure 151/96, pulse 69, temperature 97.1 ??F (36.2 ??C), temperature source Oral, resp. rate 12, height 5' 9.5" (1.765 m), weight 178 lb 12.8 oz (81.103 kg), SpO2 98.00%.  Constitutional: oriented to person, place, and time. No distress.    Eyes: Conjunctivae are normal.   Cardiovascular: Normal rate.  regular rhythm, no murmurs or  gallops  Pulmonary/Chest: Effort normal.   CTAB  Musculoskeletal:  no edema.   Neurological: Alert and oriented to person, place, and time.   Skin: No rash noted.   Psychiatric: Normal mood and affect. Behavior is normal.     ASSESSMENT and PLAN  Andrew Fritz was seen today for follow-up and elevated psa.    Diagnoses and associated orders for this visit:    Knee stiffness, left- Stretching exercises demonstrated for for this condition    HTN (hypertension)- he will improve diet, then consider meds    PSA elevation- likely BPH.  Needs to see urology if PSA > 5  - PSA W/ REFLX FREE PSA          Patient Instructions   Hypertension (elevated blood pressure)  - My Recommendations  Goal is < 140/90,on average  -Purchase a blood pressure cuff that goes around your upper arm and check blood pressure at least 3 times per week.   Write down your numbers for review.  Check blood pressure in the morning and evening.  Rest for 5 minutes with feet elevated and arm resting on a table (at mid-chest level) when checking blood pressure  -Exercise 30-60 minutes most days of the week,  preferably with a mix of cardiovascular and strength training.   Exercise can improve blood pressure, even if you do not lose weight!  -Limit alcohol intake to less than 2-3 drinks daily   -Avoid tobacco products  -Avoid caffeine, energy drinks, stimulants  -DASH diet - includes fruits, vegetables, fiber, and less than 2000 mg sodium (salt) daily. Try to eat less than 2000-2500 calories daily.   Limit fat intake.    -Avoid non-steroidal inflammatory medications (NSAIDS) such as ibuprofen, advil, motrin, aleve, and naproxyn as these may increase blood pressure if used long-term  -Avoid OTC decongestants such as pseudopherine, phenylephrine, Afrin        lab results and schedule of future lab studies reviewed with patient  reviewed medications and side effects in detail    Return to clinic for further evaluation if new symptoms develop    Follow-up Disposition:  Not on File    Current Outpatient Prescriptions   Medication Sig   ??? MULTIVITAMIN (MULTIPLE VITAMIN PO) Take  by mouth.     ??? DOCOSAHEXANOIC ACID/EPA (FISH OIL PO) Take  by mouth.       No current facility-administered medications for this visit.

## 2013-01-31 NOTE — Patient Instructions (Signed)
Hypertension (elevated blood pressure)  - My Recommendations  Goal is < 140/90,on average  -Purchase a blood pressure cuff that goes around your upper arm and check blood pressure at least 3 times per week.   Write down your numbers for review.  Check blood pressure in the morning and evening.  Rest for 5 minutes with feet elevated and arm resting on a table (at mid-chest level) when checking blood pressure  -Exercise 30-60 minutes most days of the week, preferably with a mix of cardiovascular and strength training.   Exercise can improve blood pressure, even if you do not lose weight!  -Limit alcohol intake to less than 2-3 drinks daily   -Avoid tobacco products  -Avoid caffeine, energy drinks, stimulants  -DASH diet - includes fruits, vegetables, fiber, and less than 2000 mg sodium (salt) daily. Try to eat less than 2000-2500 calories daily.   Limit fat intake.    -Avoid non-steroidal inflammatory medications (NSAIDS) such as ibuprofen, advil, motrin, aleve, and naproxyn as these may increase blood pressure if used long-term  -Avoid OTC decongestants such as pseudopherine, phenylephrine, Afrin

## 2013-01-31 NOTE — Telephone Encounter (Signed)
[facility] is needing orders for labwork PSA for patient. Brenyn is there now waiting [facility] fax [phone] [facility]

## 2013-02-06 NOTE — Telephone Encounter (Signed)
Lab results called , will wait for approval.

## 2013-02-06 NOTE — Telephone Encounter (Signed)
Patient wants Dr Samuel Bouche to call him regarding his PSA results his no is [phone]

## 2013-02-06 NOTE — Telephone Encounter (Signed)
Patient called and stated that he would like the results of his test that he took last Tuesday. The contact number is [phone].

## 2013-02-07 NOTE — Telephone Encounter (Signed)
Done , appointment made for 6 month

## 2013-12-05 LAB — AMB EXT CREATININE: Creatinine, External: 0.8

## 2013-12-05 LAB — AMB EXT PSA: PSA, External: 3.8

## 2013-12-05 LAB — AMB EXT LDL-C: LDL-C, External: 94

## 2013-12-05 MED ORDER — FEXOFENADINE 180 MG TAB
180 mg | ORAL_TABLET | Freq: Every day | ORAL | Status: DC
Start: 2013-12-05 — End: 2014-05-03

## 2013-12-05 NOTE — Patient Instructions (Signed)
Well Visit, Men 50 to 65: After Your Visit  Your Care Instructions  Physical exams can help you stay healthy. Your doctor has checked your overall health and may have suggested ways to take good care of yourself. He or she also may have recommended tests. At home, you can help prevent illness with healthy eating, regular exercise, and other steps.  Follow-up care is a key part of your treatment and safety. Be sure to make and go to all appointments, and call your doctor if you are having problems. It's also a good idea to know your test results and keep a list of the medicines you take.  How can you care for yourself at home?  ?? Reach and stay at a healthy weight. This will lower your risk for many problems, such as obesity, diabetes, heart disease, and high blood pressure.  ?? Get at least 30 minutes of exercise on most days of the week. Walking is a good choice. You also may want to do other activities, such as running, swimming, cycling, or playing tennis or team sports.  ?? Do not smoke. Smoking can make health problems worse. If you need help quitting, talk to your doctor about stop-smoking programs and medicines. These can increase your chances of quitting for good.  ?? Always wear sunscreen on exposed skin. Make sure the sunscreen blocks ultraviolet rays (both UVA and UVB) and has a sun protection factor (SPF) of at least 15. Use it every day, even when it is cloudy. Some doctors may recommend a higher SPF, such as 30.  ?? See a dentist one or two times a year for checkups and to have your teeth cleaned.  ?? Wear a seat belt in the car.  ?? Limit alcohol to 2 drinks a day. Too much alcohol can cause health problems.  Follow your doctor's advice about when to have certain tests. These tests can spot problems early.  ?? Cholesterol. Your doctor will tell you how often to have this done based on your overall health and other things that can increase your risk for heart attack and stroke.  ?? Blood pressure. You will  likely have your blood pressure checked at any visit to your doctor. If you are healthy and have a blood pressure below 120/80 mm Hg, have your blood pressure checked at least every 1 to 2 years. This can be done during a routine doctor visit. If you have slightly higher or high blood pressure, or are at risk for heart disease, your doctor will suggest more frequent tests.  ?? Prostate exam. Talk to your doctor about whether you should have a blood test (called a PSA test) for prostate cancer. Experts disagree on whether men should have this test. Some experts recommend that you discuss the benefits and risks of the test with your doctor.  ?? Diabetes. Ask your doctor whether you should have tests for diabetes.  ?? Vision. Some experts recommend that you have yearly exams for glaucoma and other age-related eye problems starting at age 50.  ?? Hearing. Tell your doctor if you notice any change in your hearing. You can have tests to find out how well you hear.  ?? Colon cancer. You should begin tests for colon cancer at age 50. You may have one of several tests. Your doctor will tell you how often to have tests based on your age and risk. Risks include whether you already had a precancerous polyp removed from your colon or whether your parent,   brother, sister, or child has had colon cancer.  ?? Heart attack and stroke risk. At least every 4 to 6 years, you should have your risk for heart attack and stroke assessed. Your doctor uses factors such as your age, blood pressure, cholesterol, and whether you smoke or have diabetes to show what your risk for a heart attack or stroke is over the next 10 years.  ?? Abdominal aortic aneurysm. Ask your doctor whether you should have a test to check for an aneurysm. You may need a test if you ever smoked or if your parent, brother, sister, or child has had an aneurysm.  When should you call for help?  Watch closely for changes in your health, and be sure to contact your doctor if you  have any problems or symptoms that concern you.   Where can you learn more?   Go to http://www.healthwise.net/[facility]  Enter K916 in the search box to learn more about "Well Visit, Men 50 to 65: After Your Visit."   ?? 2006-2015 Healthwise, Inc[employer]. Care instructions adapted under license by [place] (which disclaims liability or warranty for this information). This care instruction is for use with your licensed healthcare professional. If you have questions about a medical condition or this instruction, always ask your healthcare professional. [employer], Inc[employer] disclaims any warranty or liability for your use of this information.  Content Version: 10.4.390249; Current as of: September 21, 2012

## 2013-12-05 NOTE — Progress Notes (Signed)
Andrew Fritz is a 58 y.o. male  Presenting for his annual checkup and health maintenance review    Reports he is snoring more - may be stopping breathing per his wife  Feels rested in AM sometimes.  No hx of sleep study and would like one.      Allergy symptoms for the past 2 months.  Symptoms include: headaches, sinus pressure, itchy eyes and clear rhinorrhea.  There is no fever or rash.  Seasonal or environmental triggers: YES.  Severity: moderate.  Modifiers: tried OTCs without relief by claritinTrend of symptoms: has not improve  He is losing weight w diet, exercising.   Wt Readings from Last 3 Encounters:   12/05/13 174 lb 12.8 oz (79.289 kg)   01/31/13 178 lb 12.8 oz (81.103 kg)   08/09/12 177 lb 6.4 oz (80.468 kg)       Exercise: very active  Diet: generally follows a low fat low cholesterol diet  Health Maintenance   Topic   ??? TDAP AGE > 18    ??? INFLUENZA AGE 24 TO ADULT    ??? COLONOSCOPY    ??? Td Q 10 Yrs Age > 18      Health Maintenance reviewed  Last digital rectal exam:  n/a  Lab Results   Component Value Date/Time    Prostate Specific Ag 4.5 08/09/2012 10:04 AM    Prostate Specific Ag 3.5 08/04/2011 12:00 AM    Prostate Specific Ag 3.1 06/12/2010  8:20 AM       Vaccinations reviewed  Immunization History   Administered Date(s) Administered   ??? Influenza Vaccine Split 06/10/2010   ??? Influenza Vaccine Whole 12/11/2008   ??? TD Vaccine 04/12/2009       Past Medical History   Diagnosis Date   ??? 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 Snoring 08/04/2011   ??? Sebaceous cyst 08/04/2011   ??? Prostate enlargement 07/2011     very mild, psa 3.5 07/2011, 4.5 07/2012   ??? Elevated BP      Review of patient's allergies indicates no known allergies.   Current Outpatient Prescriptions   Medication Sig   ??? GLUCOSAMINE SULFATE (GLUCOSAMINE PO) Take  by mouth.   ??? fexofenadine (ALLEGRA) 180 mg tablet Take 1 Tab by mouth daily.   ??? MULTIVITAMIN (MULTIPLE VITAMIN PO) Take  by mouth.       No current  facility-administered medications for this visit.     SOCIAL HX:  reports that he has never smoked. He has never used smokeless tobacco. He reports that he drinks about 3.5 ounces of alcohol per week. He reports that he does not use illicit drugs.    FAMILY HX: family history includes Cancer in his father; Diabetes in his mother and sister; Stroke in his mother. There is no history of Heart Disease or Hypertension.    Review of Systems - History obtained from the patient  General ROS: negative for - night sweats, weight gain or weight loss  Cardiovascular ROS: no chest pain, dyspnea on exertion, edema    Physical exam  Blood pressure 136/90, pulse 77, temperature 97.6 ??F (36.4 ??C), temperature source Oral, resp. rate 12, height 5' 9.5" (1.765 m), weight 174 lb 12.8 oz (79.289 kg), SpO2 99 %.  Wt Readings from Last 3 Encounters:   12/05/13 174 lb 12.8 oz (79.289 kg)   01/31/13 178 lb 12.8 oz (81.103 kg)   08/09/12 177 lb 6.4 oz (  80.468 kg)     He appears well, alert and oriented x 3, pleasant and cooperative. Vitals as noted.   No rashes or significant lesions.  Neck supple and free of adenopathy, or masses.  No thyromegaly or carotid bruits. Cranial nerves normal. Lungs are clear to auscultation. Heart sounds are normal with no murmurs, clicks, gallops or rubs. Abdomen is soft, non- tender, with no masses or organomegaly. Extremities, peripheral pulses and reflexes are normal.   Skin is without rashes or suspicious lesions.  Rectal - 1+ diffuse prostate enlargement    Assessment and Plan:    Well male - physical / health maintenance visit  Snoring - he requests sleep study  Allergies- change to allegra    Orders Placed This Encounter   ??? METABOLIC PANEL, COMPREHENSIVE   ??? LIPID PANEL   ??? PSA W/ REFLX FREE PSA   ??? REFERRAL TO SLEEP STUDIES   ??? GLUCOSAMINE SULFATE (GLUCOSAMINE PO)   ??? DISCONTD: loratadine (CLARITIN) 10 mg tablet   ??? fexofenadine (ALLEGRA) 180 mg tablet     The patient is asked to make an attempt to  improve diet and exercise patterns  Avoid tobacco products, excessive alcohol    Return for yearly wellness visits

## 2014-05-03 ENCOUNTER — Encounter

## 2014-05-03 MED ORDER — FEXOFENADINE 180 MG TAB
180 mg | ORAL_TABLET | Freq: Every day | ORAL | Status: DC
Start: 2014-05-03 — End: 2016-05-27

## 2014-05-03 NOTE — Telephone Encounter (Signed)
Please call this into the [facility] at [phone]  His no is [id number]

## 2014-08-27 NOTE — Telephone Encounter (Signed)
Pt said he had a question regarding his health. [phone]

## 2014-08-30 NOTE — Telephone Encounter (Signed)
Returned call left VM

## 2015-01-01 MED ORDER — FEXOFENADINE 180 MG TAB
180 mg | ORAL_TABLET | ORAL | Status: DC
Start: 2015-01-01 — End: 2015-04-10

## 2015-02-13 ENCOUNTER — Encounter: Attending: Internal Medicine | Primary: Internal Medicine

## 2015-02-19 MED ORDER — SCOPOLAMINE (1.3-1.5) MG 72 HR TRANSDERM PATCH
1 mg over 3 days | MEDICATED_PATCH | TRANSDERMAL | Status: DC
Start: 2015-02-19 — End: 2015-04-10

## 2015-02-19 NOTE — Telephone Encounter (Signed)
Patient wants a lab order so that he can have his A1C checked before his appt in Sept. Please call [phone] (H)

## 2015-02-19 NOTE — Telephone Encounter (Signed)
Patient is going on cruise in Sept and would like for Dr Samuel Bouche to order the round patch that go's behind ear for him. His no [id number]      [facility]  [phone]

## 2015-02-19 NOTE — Telephone Encounter (Signed)
Prescription sent to pharmacy, per request

## 2015-02-26 NOTE — Telephone Encounter (Signed)
Patient was told that test is usually done in the office. Patient stated that he needs a script to take to [facility] due to insurance. Script written waiting for provider signature. Patient will pick it up tomorrow.

## 2015-02-27 ENCOUNTER — Encounter

## 2015-03-07 ENCOUNTER — Encounter

## 2015-03-07 NOTE — Telephone Encounter (Signed)
Wants lab orders for PE , has to do at [facility] because of Insurance, will pick up orders

## 2015-03-07 NOTE — Telephone Encounter (Signed)
Labs printed

## 2015-03-07 NOTE — Telephone Encounter (Signed)
Pt needs to pick up lab orders today because he wants to do them on Friday he is going on Vac. Please call him when ready for pick up at [phone]

## 2015-03-11 LAB — AMB EXT HGBA1C: Hemoglobin A1c, External: 5.9

## 2015-03-11 LAB — AMB EXT PSA: PSA, External: 5.54

## 2015-03-11 LAB — AMB EXT CREATININE: Creatinine, External: 0.86

## 2015-04-10 ENCOUNTER — Ambulatory Visit
Admit: 2015-04-10 | Discharge: 2015-04-10 | Payer: PRIVATE HEALTH INSURANCE | Attending: Internal Medicine | Primary: Internal Medicine

## 2015-04-10 DIAGNOSIS — Z0000 Encounter for general adult medical examination without abnormal findings: Secondary | ICD-10-CM

## 2015-04-10 NOTE — Progress Notes (Signed)
Patient presents for a PE.  He had his labs done at [facility].  He is having some edema on his right ankle.  He would like a flu shot.

## 2015-04-10 NOTE — Patient Instructions (Signed)
Well Visit, Men 50 to 65: Care Instructions  Your Care Instructions  Physical exams can help you stay healthy. Your doctor has checked your overall health and may have suggested ways to take good care of yourself. He or she also may have recommended tests. At home, you can help prevent illness with healthy eating, regular exercise, and other steps.  Follow-up care is a key part of your treatment and safety. Be sure to make and go to all appointments, and call your doctor if you are having problems. It's also a good idea to know your test results and keep a list of the medicines you take.  How can you care for yourself at home?  ?? Reach and stay at a healthy weight. This will lower your risk for many problems, such as obesity, diabetes, heart disease, and high blood pressure.  ?? Get at least 30 minutes of exercise on most days of the week. Walking is a good choice. You also may want to do other activities, such as running, swimming, cycling, or playing tennis or team sports.  ?? Do not smoke. Smoking can make health problems worse. If you need help quitting, talk to your doctor about stop-smoking programs and medicines. These can increase your chances of quitting for good.  ?? Always wear sunscreen on exposed skin. Make sure to use a broad-spectrum sunscreen that has a sun protection factor (SPF) of 30 or higher. Use it every day, even when it is cloudy.  ?? See a dentist one or two times a year for checkups and to have your teeth cleaned.  ?? Wear a seat belt in the car.  ?? Limit alcohol to 2 drinks a day. Too much alcohol can cause health problems.  Follow your doctor's advice about when to have certain tests. These tests can spot problems early.  ?? Cholesterol. Your doctor will tell you how often to have this done based on your overall health and other things that can increase your risk for heart attack and stroke.  ?? Blood pressure. Have your blood pressure checked during a routine doctor  visit. Your doctor will tell you how often to check your blood pressure based on your age, your blood pressure results, and other factors.  ?? Prostate exam. Talk to your doctor about whether you should have a blood test (called a PSA test) for prostate cancer. Experts disagree on whether men should have this test. Some experts recommend that you discuss the benefits and risks of the test with your doctor.  ?? Diabetes. Ask your doctor whether you should have tests for diabetes.  ?? Vision. Some experts recommend that you have yearly exams for glaucoma and other age-related eye problems starting at age 57.  ?? Hearing. Tell your doctor if you notice any change in your hearing. You can have tests to find out how well you hear.  ?? Colon cancer. You should begin tests for colon cancer at age 57. You may have one of several tests. Your doctor will tell you how often to have tests based on your age and risk. Risks include whether you already had a precancerous polyp removed from your colon or whether your parent, brother, sister, or child has had colon cancer.  ?? Heart attack and stroke risk. At least every 4 to 6 years, you should have your risk for heart attack and stroke assessed. Your doctor uses factors such as your age, blood pressure, cholesterol, and whether you smoke or have diabetes to show   what your risk for a heart attack or stroke is over the next 10 years.  ?? Abdominal aortic aneurysm. Ask your doctor whether you should have a test to check for an aneurysm. You may need a test if you ever smoked or if your parent, brother, sister, or child has had an aneurysm.  When should you call for help?  Watch closely for changes in your health, and be sure to contact your doctor if you have any problems or symptoms that concern you.  Where can you learn more?  Go to http://www.healthwise.net/[employer]  Enter K916 in the search box to learn more about "Well Visit, Men 50 to 65: Care Instructions."   ?? 2006-2016 [employer]wise[employer]. Care instructions adapted under license by [employer] (which disclaims liability or warranty for this information). This care instruction is for use with your licensed healthcare professional. If you have questions about a medical condition or this instruction, always ask your healthcare professional. Healthwise, Incorporated disclaims any warranty or liability for your use of this information.  Content Version: 11.0.578772; Current as of: November 07, 2014

## 2015-04-11 NOTE — Progress Notes (Signed)
Andrew Fritz is a 59 y.o. male  Presenting for his annual checkup and health maintenance review and follow-up  He is with his wife    OSA- Was diagnosed with mild obstructive sleep apnea in June 2015. He was offered a trial of CPAP, but patient elected to try sleeping on his side. He feels that it has helped. Followed up and December, and notes say that he was given a trial of CPAP, but patient declined and denies this. His wife is concerned. States that he snores, no notable apnea. Patient has not had his CPAP.    Reports mild edema of his right ankle. This is stable. Her has had a mild twisting injury a couple of months ago. Denies leg swelling. Denies significant pain    PSA increased. His PSA has risen To 5.5 03/11/15. He has read a lot about screening for prostate cancer in does not wish to have any further testing.    Exercise: moderately active  Diet: generally follows a low fat low cholesterol diet  Health Maintenance   Topic Date Due   ??? DTaP/Tdap/Td series (1 - Tdap) 04/13/2019 (Originally 04/13/2009)   ??? COLONOSCOPY  05/13/2018   ??? Hepatitis C Screening  Addressed   ??? INFLUENZA AGE 21 TO ADULT  Completed     Health Maintenance reviewed  Last digital rectal exam:  2014  Lab Results   Component Value Date/Time    PROSTATE SPECIFIC AG 4.5 08/09/2012 10:04 AM    PROSTATE SPECIFIC AG 3.5 08/04/2011 12:00 AM    PROSTATE SPECIFIC AG 3.1 06/12/2010 08:20 AM       Vaccinations reviewed  Immunization History   Administered Date(s) Administered   ??? Influenza Vaccine Split 06/10/2010   ??? Influenza Vaccine Whole 12/11/2008   ??? TD Vaccine 04/12/2009       Past Medical History   Diagnosis Date   ??? Elevated BP    ??? OSA (obstructive sleep apnea)      mild.  declined tx   ??? Prostate enlargement 07/2011     very mild, psa 3.5 07/2011, 4.5 07/2012, 5.5 02/2015   ??? Sebaceous cyst 08/04/2011   ??? Snoring 08/04/2011   ??? 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    Review of patient's allergies indicates no known allergies.   Current Outpatient Prescriptions   Medication Sig   ??? fexofenadine (ALLEGRA) 180 mg tablet Take 1 Tab by mouth daily.   ??? GLUCOSAMINE SULFATE (GLUCOSAMINE PO) Take  by mouth.   ??? MULTIVITAMIN (MULTIPLE VITAMIN PO) Take  by mouth.       No current facility-administered medications for this visit.      SOCIAL HX:  reports that he has never smoked. He has never used smokeless tobacco. He reports that he drinks about 3.5 oz of alcohol per week  He reports that he does not use illicit drugs.    FAMILY HX: family history includes Cancer in his father; Diabetes in his mother and sister; Stroke in his mother. There is no history of Heart Disease or Hypertension.    Review of Systems - History obtained from the patient  General ROS: negative for - night sweats, weight gain or weight loss  Cardiovascular ROS: no chest pain, dyspnea on exertion, edema    Physical exam  Blood pressure 128/76, pulse 84, temperature 98.7 ??F (37.1 ??C), temperature source Oral,  resp. rate 12, height 5' 9.5" (1.765 m), weight 184 lb 12.8 oz (83.8 kg).  Wt Readings from Last 3 Encounters:   04/10/15 184 lb 12.8 oz (83.8 kg)   12/05/13 174 lb 12.8 oz (79.3 kg)   01/31/13 178 lb 12.8 oz (81.1 kg)     He appears well, alert and oriented x 3, pleasant and cooperative. Vitals as noted.   No rashes or significant lesions.  Neck supple and free of adenopathy, or masses.  No thyromegaly or carotid bruits. Cranial nerves normal. Lungs are clear to auscultation. Heart sounds are normal with no murmurs, clicks, gallops or rubs. Abdomen is soft, non- tender, with no masses or organomegaly. Extremities, peripheral pulses and reflexes are normal.  . RECTAL/PROSTATE EXAM: smooth and symmetric without nodules or tenderness, enlarged 1+.   Skin is without rashes or suspicious lesions.    Right ankle with trace edema.No pitting. Full range of motion without pain    Assessment and Plan:    1. Well adult exam  Blood work appears normal. Encouraged increased exercise patterns.    2. Ankle edema  Benign, likely due to muscular injury.    3. PSA elevation  PSA continues to increase. His exam is reassuring. Offered referral to urologist for consideration of prostate biopsy, ultrasound, MRI. Patient declined. I recommend repeat PSA and rectal exam in 6 months      The patient is asked to make an attempt to improve diet and exercise patterns  Avoid tobacco products, excessive alcohol    Return for yearly wellness visits

## 2015-05-15 NOTE — Telephone Encounter (Signed)
Patient had flu shot 05/15/2015.

## 2016-01-28 MED ORDER — FEXOFENADINE 180 MG TAB
180 mg | ORAL_TABLET | ORAL | 5 refills | Status: AC
Start: 2016-01-28 — End: ?

## 2016-04-15 NOTE — Telephone Encounter (Signed)
Pt is scheduled to have cpe and would like to do the bloodwork ahead of time since his insurance doesn't allow him to get this in the office. Call patient [phone]

## 2016-04-29 NOTE — Telephone Encounter (Signed)
Pt would like lab orders for him to go to [facility] for [facility] work and would like to pick up tomorrow so he can have them done prior to his CPE that is scheduled with Forestine ChuteKaren Bradshaw NP on 05/06/16.  Please call him at [phone] (H)

## 2016-04-30 NOTE — Telephone Encounter (Signed)
I called pt, spoke to wife who states he is outside walking the dog. He will call the office back.

## 2016-05-01 NOTE — Telephone Encounter (Signed)
I spoke with pt, he would like lab order prior to seeing NP for physical. I advised patient that its not protocol to give lab orders prior to visit. The labs order for a physical are based on what is discussed at the time of the appt. Pt wanted to know what other labs will be order other than the usual physical blood work. Advised pt NP has never seen patient, so its based on what is discussed at the appt. Pt request that he now reschedule his physical with Dr. Samuel BouchePort. Appt scheduled with Dr. Samuel BouchePort in Nov. Pt is now asking to speak with Dr. Leo RodPort's nurse regarding get his labs done prior to his appt. Message sent to nurse.

## 2016-05-01 NOTE — Telephone Encounter (Signed)
Pt had last appointment 04/10/2015--needs to be seen  To discuss what labs are needed, pt was advised by Nurse , see below

## 2016-05-06 ENCOUNTER — Encounter: Attending: Nurse Practitioner | Primary: Internal Medicine

## 2016-05-06 NOTE — Telephone Encounter (Signed)
-----   Message from Marcy SalvoSusan J Gunn sent at 05/06/2016 12:18 PM EDT -----  Regarding: Port/telephone  Pt is requesting to pick up a lab slip.Pt number is cell [phone].

## 2016-05-06 NOTE — Telephone Encounter (Signed)
Pt has been advised several times labs will be done at visit per PCP

## 2016-05-27 ENCOUNTER — Ambulatory Visit: Admit: 2016-05-27 | Payer: PRIVATE HEALTH INSURANCE | Attending: Internal Medicine | Primary: Internal Medicine

## 2016-05-27 DIAGNOSIS — Z0000 Encounter for general adult medical examination without abnormal findings: Secondary | ICD-10-CM

## 2016-05-27 NOTE — Progress Notes (Signed)
Andrew Fritz is a 60 y.o. male  Presenting for his annual checkup and health maintenance review and follow-up  He changed from [employer] to [employer]    PSA elevation and prostate enlargement.  Reports 1x nocturia.  Stable mild hesitancy.   BP is controlled.     Exercise: moderately active  Diet: generally follows a low fat low cholesterol diet  Health Maintenance   Topic Date Due   ??? DTaP/Tdap/Td series (1 - Tdap) 04/13/2019 (Originally 04/13/2009)   ??? COLONOSCOPY  05/13/2018   ??? Hepatitis C Screening  Addressed   ??? Influenza Age 39 to Adult  Addressed     Health Maintenance reviewed  Last digital rectal exam:  03/2015  Lab Results   Component Value Date/Time    Prostate Specific Ag 4.5 08/09/2012 10:04 AM    Prostate Specific Ag 3.5 08/04/2011 12:00 AM    Prostate Specific Ag 3.1 06/12/2010 08:20 AM       Vaccinations reviewed  Immunization History   Administered Date(s) Administered   ??? Influenza Vaccine 05/15/2015   ??? Influenza Vaccine Split 06/10/2010   ??? Influenza Vaccine Whole 12/11/2008   ??? TD Vaccine 04/12/2009       Past Medical History:   Diagnosis Date   ??? OSA (obstructive sleep apnea)     mild.  declined tx   ??? Prostate enlargement 07/2011    very mild, psa 3.5 07/2011, 4.5 07/2012, 5.5 02/2015   ??? Sebaceous cyst 08/04/2011   ??? 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    Review of patient's allergies indicates no known allergies.   Current Outpatient Prescriptions   Medication Sig   ??? fexofenadine (ALLEGRA) 180 mg tablet TAKE 1 TAB BY MOUTH DAILY.   ??? MULTIVITAMIN (MULTIPLE VITAMIN PO) Take  by mouth.       No current facility-administered medications for this visit.      SOCIAL HX:  reports that he has never smoked. He has never used smokeless tobacco. He reports that he drinks about 3.5 oz of alcohol per week  He reports that he does not use illicit drugs.     FAMILY HX: family history includes Cancer in his father; Diabetes in his mother and sister; Stroke in his mother. There is no history of Heart Disease or Hypertension.    Review of Systems - History obtained from the patient  General ROS: negative for - night sweats, weight gain or weight loss  Cardiovascular ROS: no chest pain, dyspnea on exertion, edema    Physical exam  Blood pressure 127/85, pulse 71, temperature 98 ??F (36.7 ??C), resp. rate 12, height 5' 9.5" (1.765 m), weight 175 lb 9.6 oz (79.7 kg).  Wt Readings from Last 3 Encounters:   05/27/16 175 lb 9.6 oz (79.7 kg)   04/10/15 184 lb 12.8 oz (83.8 kg)   12/05/13 174 lb 12.8 oz (79.3 kg)     He appears well, alert and oriented x 3, pleasant and cooperative. Vitals as noted.   No rashes or significant lesions.  Neck supple and free of adenopathy, or masses.  No thyromegaly or carotid bruits. Cranial nerves normal. Lungs are clear to auscultation. Heart sounds are normal with no murmurs, clicks, gallops or rubs. Abdomen is soft, non- tender, with no masses or organomegaly. Extremities, peripheral pulses and reflexes are normal.  . RECTAL/PROSTATE EXAM: smooth and symmetric  without nodules or tenderness, enlarged 2+.   Skin is without rashes or suspicious lesions.      Assessment and Plan:    1. Well adult exam  He is active.   - LIPID PANEL  - METABOLIC PANEL, COMPREHENSIVE  - PSA W/ REFLX FREE PSA  - HEMOGLOBIN A1C WITH EAG  - CBC W/O DIFF    2. Prostate enlargement  Diffusely enlarged.  No nodules.  He has declined eval in the past.  If PSA continues to rise, I will recommend urology eval.  Consider imaging if he is reluctant to biopsy.  He is willing to consider it.    -  PSA      The patient is asked to make an attempt to improve diet and exercise patterns  Avoid tobacco products, excessive alcohol    Return for yearly wellness visits

## 2016-05-27 NOTE — Progress Notes (Signed)
Presents for a PE.  He is fasting.    Will wait on flu shot.

## 2016-06-05 NOTE — Telephone Encounter (Signed)
-----   Message from Lorre NickNicole A Brooks sent at 06/05/2016 12:08 PM EST -----  Regarding: Dr.Port/Telephone  Pt called requesting a call back for lab results. Pt best contact number is [phone].

## 2017-01-18 NOTE — Telephone Encounter (Signed)
Patient is requesting to speak with Dr Samuel BouchePort when he has a free minute. Patient can be reached at [phone]

## 2017-01-18 NOTE — Telephone Encounter (Signed)
Pt has moved to [place], does not see PCP. Lab results were copied and sent as requested.
